# Patient Record
Sex: Female | Born: 1975 | Race: Black or African American | Hispanic: No | Marital: Married | Smoking: Never smoker
Health system: Southern US, Community
[De-identification: ages and names within clinical notes are randomized; demographics above are authoritative.]

## PROBLEM LIST (undated history)

## (undated) DIAGNOSIS — G43909 Migraine, unspecified, not intractable, without status migrainosus: Secondary | ICD-10-CM

## (undated) DIAGNOSIS — J45909 Unspecified asthma, uncomplicated: Secondary | ICD-10-CM

## (undated) HISTORY — DX: Migraine, unspecified, not intractable, without status migrainosus: G43.909

## (undated) HISTORY — DX: Unspecified asthma, uncomplicated: J45.909

---

## 2000-12-19 ENCOUNTER — Other Ambulatory Visit: Admission: RE | Admit: 2000-12-19 | Discharge: 2000-12-19 | Payer: Self-pay | Admitting: Internal Medicine

## 2001-09-10 HISTORY — PX: FOOT SURGERY: SHX648

## 2002-03-05 ENCOUNTER — Other Ambulatory Visit: Admission: RE | Admit: 2002-03-05 | Discharge: 2002-03-05 | Payer: Self-pay | Admitting: Internal Medicine

## 2003-05-05 ENCOUNTER — Other Ambulatory Visit: Admission: RE | Admit: 2003-05-05 | Discharge: 2003-05-05 | Payer: Self-pay | Admitting: Obstetrics and Gynecology

## 2008-10-28 ENCOUNTER — Ambulatory Visit: Payer: Self-pay | Admitting: Diagnostic Radiology

## 2008-10-28 ENCOUNTER — Emergency Department (HOSPITAL_BASED_OUTPATIENT_CLINIC_OR_DEPARTMENT_OTHER): Admission: EM | Admit: 2008-10-28 | Discharge: 2008-10-28 | Payer: Self-pay | Admitting: Otolaryngology

## 2010-12-26 LAB — CBC
MCHC: 33.3 g/dL (ref 30.0–36.0)
MCV: 95.4 fL (ref 78.0–100.0)
Platelets: 152 10*3/uL (ref 150–400)
RDW: 13.4 % (ref 11.5–15.5)

## 2010-12-26 LAB — BASIC METABOLIC PANEL
BUN: 10 mg/dL (ref 6–23)
CO2: 27 mEq/L (ref 19–32)
Chloride: 102 mEq/L (ref 96–112)
Creatinine, Ser: 0.6 mg/dL (ref 0.4–1.2)
Glucose, Bld: 101 mg/dL — ABNORMAL HIGH (ref 70–99)

## 2010-12-26 LAB — DIFFERENTIAL
Basophils Relative: 0 % (ref 0–1)
Eosinophils Absolute: 0 10*3/uL (ref 0.0–0.7)
Neutrophils Relative %: 74 % (ref 43–77)

## 2010-12-26 LAB — POCT CARDIAC MARKERS: Myoglobin, poc: 28 ng/mL (ref 12–200)

## 2012-03-30 DIAGNOSIS — J45909 Unspecified asthma, uncomplicated: Secondary | ICD-10-CM | POA: Insufficient documentation

## 2012-03-30 DIAGNOSIS — R1110 Vomiting, unspecified: Secondary | ICD-10-CM | POA: Insufficient documentation

## 2012-03-31 ENCOUNTER — Emergency Department (HOSPITAL_BASED_OUTPATIENT_CLINIC_OR_DEPARTMENT_OTHER)
Admission: EM | Admit: 2012-03-31 | Discharge: 2012-03-31 | Disposition: A | Discharge status: Home / Self Care | Payer: Managed Care, Other (non HMO) | Attending: Emergency Medicine | Admitting: Emergency Medicine

## 2012-03-31 LAB — URINE MICROSCOPIC-ADD ON

## 2012-03-31 LAB — BASIC METABOLIC PANEL
BUN: 12 mg/dL (ref 6–23)
CO2: 27 mEq/L (ref 19–32)
Calcium: 9.6 mg/dL (ref 8.4–10.5)
GFR calc non Af Amer: >90 mL/min (ref >90)
Glucose, Bld: 98 mg/dL (ref 70–99)
Potassium: 3.9 mEq/L (ref 3.5–5.1)

## 2012-03-31 LAB — URINALYSIS, ROUTINE W REFLEX MICROSCOPIC
Glucose, UA: NEGATIVE mg/dL
Hgb urine dipstick: NEGATIVE
Protein, ur: 30 mg/dL — AB
pH: 7.5 (ref 5.0–8.0)

## 2012-03-31 MED FILL — Ondansetron HCl Inj 4 MG/2ML (2 MG/ML): INTRAMUSCULAR | Qty: 2 | Status: AC

## 2013-01-05 ENCOUNTER — Encounter: Payer: Self-pay | Admitting: Neurology

## 2013-01-05 ENCOUNTER — Ambulatory Visit (INDEPENDENT_AMBULATORY_CARE_PROVIDER_SITE_OTHER): Payer: Managed Care, Other (non HMO) | Admitting: Neurology

## 2013-01-05 VITALS — BP 104/63 | HR 64 | Ht 66.75 in | Wt 144.0 lb

## 2013-01-05 DIAGNOSIS — M549 Dorsalgia, unspecified: Secondary | ICD-10-CM | POA: Insufficient documentation
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
## 2013-10-19 ENCOUNTER — Emergency Department (HOSPITAL_BASED_OUTPATIENT_CLINIC_OR_DEPARTMENT_OTHER): Payer: No Typology Code available for payment source

## 2013-10-19 ENCOUNTER — Encounter (HOSPITAL_BASED_OUTPATIENT_CLINIC_OR_DEPARTMENT_OTHER): Payer: Self-pay | Admitting: Emergency Medicine

## 2013-10-19 ENCOUNTER — Emergency Department (HOSPITAL_BASED_OUTPATIENT_CLINIC_OR_DEPARTMENT_OTHER)
Admission: EM | Admit: 2013-10-19 | Discharge: 2013-10-19 | Disposition: A | Discharge status: Home / Self Care | Payer: No Typology Code available for payment source | Attending: Emergency Medicine | Admitting: Emergency Medicine

## 2013-10-19 DIAGNOSIS — S139XXA Sprain of joints and ligaments of unspecified parts of neck, initial encounter: Secondary | ICD-10-CM | POA: Insufficient documentation

## 2013-10-19 DIAGNOSIS — Z8679 Personal history of other diseases of the circulatory system: Secondary | ICD-10-CM | POA: Insufficient documentation

## 2013-10-19 DIAGNOSIS — Y9389 Activity, other specified: Secondary | ICD-10-CM | POA: Insufficient documentation

## 2013-10-19 DIAGNOSIS — J45909 Unspecified asthma, uncomplicated: Secondary | ICD-10-CM | POA: Insufficient documentation

## 2013-10-19 DIAGNOSIS — S161XXA Strain of muscle, fascia and tendon at neck level, initial encounter: Secondary | ICD-10-CM

## 2013-10-19 DIAGNOSIS — IMO0002 Reserved for concepts with insufficient information to code with codable children: Secondary | ICD-10-CM | POA: Insufficient documentation

## 2013-10-19 DIAGNOSIS — S46919A Strain of unspecified muscle, fascia and tendon at shoulder and upper arm level, unspecified arm, initial encounter: Secondary | ICD-10-CM

## 2013-10-19 DIAGNOSIS — Y92410 Unspecified street and highway as the place of occurrence of the external cause: Secondary | ICD-10-CM | POA: Insufficient documentation

## 2013-10-19 MED ORDER — CYCLOBENZAPRINE HCL 5 MG PO TABS: 5.0000 mg | ORAL_TABLET | Freq: Two times a day (BID) | ORAL | Status: AC | PRN

## 2013-10-19 MED ORDER — HYDROCODONE-ACETAMINOPHEN 5-325 MG PO TABS: 1.0000 | ORAL_TABLET | ORAL | Status: AC | PRN
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
## 2013-10-19 NOTE — ED Notes (Signed)
NP at bedside.
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
## 2013-10-25 NOTE — ED Provider Notes (Signed)
Medical screening examination/treatment/procedure(s) were performed by non-physician practitioner and as supervising physician I was immediately available for consultation/collaboration.  EKG Interpretation   None         Chrystian Cupples, MD 10/25/13 0705 
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
